# Patient Record
Sex: Female | Born: 1950 | Race: White | Hispanic: No | Smoking: Current every day smoker
Health system: Southern US, Community
[De-identification: ages and names within clinical notes are randomized; demographics above are authoritative.]

## PROBLEM LIST (undated history)

## (undated) DIAGNOSIS — M549 Dorsalgia, unspecified: Secondary | ICD-10-CM

## (undated) DIAGNOSIS — R42 Dizziness and giddiness: Secondary | ICD-10-CM

## (undated) DIAGNOSIS — E7800 Pure hypercholesterolemia, unspecified: Secondary | ICD-10-CM

## (undated) DIAGNOSIS — IMO0002 Reserved for concepts with insufficient information to code with codable children: Secondary | ICD-10-CM

## (undated) HISTORY — PX: ABDOMINAL HYSTERECTOMY: SHX81

## (undated) HISTORY — PX: BACK SURGERY: SHX140

## (undated) HISTORY — PX: APPENDECTOMY: SHX54

---

## 2010-02-01 ENCOUNTER — Inpatient Hospital Stay (HOSPITAL_COMMUNITY): Admission: RE | Admit: 2010-02-01 | Discharge: 2010-02-02 | Payer: Self-pay | Admitting: Neurosurgery

## 2010-03-22 ENCOUNTER — Encounter: Admission: RE | Admit: 2010-03-22 | Discharge: 2010-03-22 | Payer: Self-pay | Admitting: Neurosurgery

## 2010-05-16 ENCOUNTER — Encounter: Admission: RE | Admit: 2010-05-16 | Discharge: 2010-05-16 | Payer: Self-pay | Admitting: Neurosurgery

## 2010-08-26 ENCOUNTER — Encounter: Admission: RE | Admit: 2010-08-26 | Discharge: 2010-08-26 | Payer: Self-pay | Admitting: Neurosurgery

## 2010-10-29 ENCOUNTER — Encounter: Payer: Self-pay | Admitting: Neurosurgery

## 2010-11-23 ENCOUNTER — Other Ambulatory Visit: Payer: Self-pay | Admitting: Neurosurgery

## 2010-11-23 DIAGNOSIS — M545 Low back pain: Secondary | ICD-10-CM

## 2010-11-23 DIAGNOSIS — R29898 Other symptoms and signs involving the musculoskeletal system: Secondary | ICD-10-CM

## 2010-11-24 ENCOUNTER — Ambulatory Visit
Admission: RE | Admit: 2010-11-24 | Discharge: 2010-11-24 | Disposition: A | Discharge status: Home / Self Care | Payer: Self-pay | Source: Ambulatory Visit | Attending: Neurosurgery | Admitting: Neurosurgery

## 2010-11-24 DIAGNOSIS — M545 Low back pain: Secondary | ICD-10-CM

## 2010-11-24 DIAGNOSIS — R29898 Other symptoms and signs involving the musculoskeletal system: Secondary | ICD-10-CM

## 2010-12-27 LAB — SURGICAL PCR SCREEN
MRSA, PCR: NEGATIVE
Staphylococcus aureus: POSITIVE — AB

## 2010-12-27 LAB — BASIC METABOLIC PANEL
BUN: 13 mg/dL (ref 6–23)
CO2: 25 mEq/L (ref 19–32)
Calcium: 9.7 mg/dL (ref 8.4–10.5)
Chloride: 109 mEq/L (ref 96–112)
Creatinine, Ser: 0.64 mg/dL (ref 0.4–1.2)
GFR calc Af Amer: >60 mL/min (ref >60)

## 2010-12-27 LAB — CBC
MCHC: 35.4 g/dL (ref 30.0–36.0)
MCV: 94.5 fL (ref 78.0–100.0)
Platelets: 176 10*3/uL (ref 150–400)
RBC: 3.98 MIL/uL (ref 3.87–5.11)
RDW: 14.1 % (ref 11.5–15.5)

## 2011-03-06 ENCOUNTER — Emergency Department (HOSPITAL_BASED_OUTPATIENT_CLINIC_OR_DEPARTMENT_OTHER)
Admission: EM | Admit: 2011-03-06 | Discharge: 2011-03-06 | Disposition: A | Discharge status: Home / Self Care | Payer: Medicaid Other | Attending: Emergency Medicine | Admitting: Emergency Medicine

## 2011-03-06 ENCOUNTER — Emergency Department (INDEPENDENT_AMBULATORY_CARE_PROVIDER_SITE_OTHER): Payer: Medicaid Other

## 2011-03-06 DIAGNOSIS — N200 Calculus of kidney: Secondary | ICD-10-CM | POA: Insufficient documentation

## 2011-03-06 DIAGNOSIS — Z79899 Other long term (current) drug therapy: Secondary | ICD-10-CM | POA: Insufficient documentation

## 2011-03-06 DIAGNOSIS — E785 Hyperlipidemia, unspecified: Secondary | ICD-10-CM | POA: Insufficient documentation

## 2011-03-06 DIAGNOSIS — M549 Dorsalgia, unspecified: Secondary | ICD-10-CM | POA: Insufficient documentation

## 2011-03-06 DIAGNOSIS — R109 Unspecified abdominal pain: Secondary | ICD-10-CM

## 2011-03-06 LAB — CBC
MCHC: 34.5 g/dL (ref 30.0–36.0)
MCV: 93 fL (ref 78.0–100.0)
Platelets: 139 10*3/uL — ABNORMAL LOW (ref 150–400)
RDW: 13.7 % (ref 11.5–15.5)
WBC: 4.9 10*3/uL (ref 4.0–10.5)

## 2011-03-06 LAB — DIFFERENTIAL
Basophils Absolute: 0 10*3/uL (ref 0.0–0.1)
Eosinophils Absolute: 0 10*3/uL (ref 0.0–0.7)
Eosinophils Relative: 1 % (ref 0–5)

## 2011-03-06 LAB — URINALYSIS, ROUTINE W REFLEX MICROSCOPIC
Ketones, ur: NEGATIVE mg/dL
Nitrite: NEGATIVE
Protein, ur: NEGATIVE mg/dL

## 2011-04-07 ENCOUNTER — Other Ambulatory Visit: Payer: Self-pay | Admitting: Family Medicine

## 2011-04-07 ENCOUNTER — Ambulatory Visit
Admission: RE | Admit: 2011-04-07 | Discharge: 2011-04-07 | Disposition: A | Discharge status: Home / Self Care | Payer: Medicaid Other | Source: Ambulatory Visit | Attending: Family Medicine | Admitting: Family Medicine

## 2011-04-07 DIAGNOSIS — R05 Cough: Secondary | ICD-10-CM

## 2011-04-07 DIAGNOSIS — F17200 Nicotine dependence, unspecified, uncomplicated: Secondary | ICD-10-CM

## 2011-06-01 ENCOUNTER — Other Ambulatory Visit: Payer: Self-pay | Admitting: Family Medicine

## 2011-06-01 DIAGNOSIS — Z1231 Encounter for screening mammogram for malignant neoplasm of breast: Secondary | ICD-10-CM

## 2011-06-01 DIAGNOSIS — Z9889 Other specified postprocedural states: Secondary | ICD-10-CM

## 2011-06-08 ENCOUNTER — Ambulatory Visit: Payer: Medicaid Other

## 2011-06-20 ENCOUNTER — Ambulatory Visit: Payer: Medicaid Other

## 2011-08-09 IMAGING — CR DG CHEST 2V
2 series · 2 of 2 positions shown · non-contrast
Comparison: None

CLINICAL DATA: Cough, smoker

CHEST - 2 VIEW

[w chest pa]
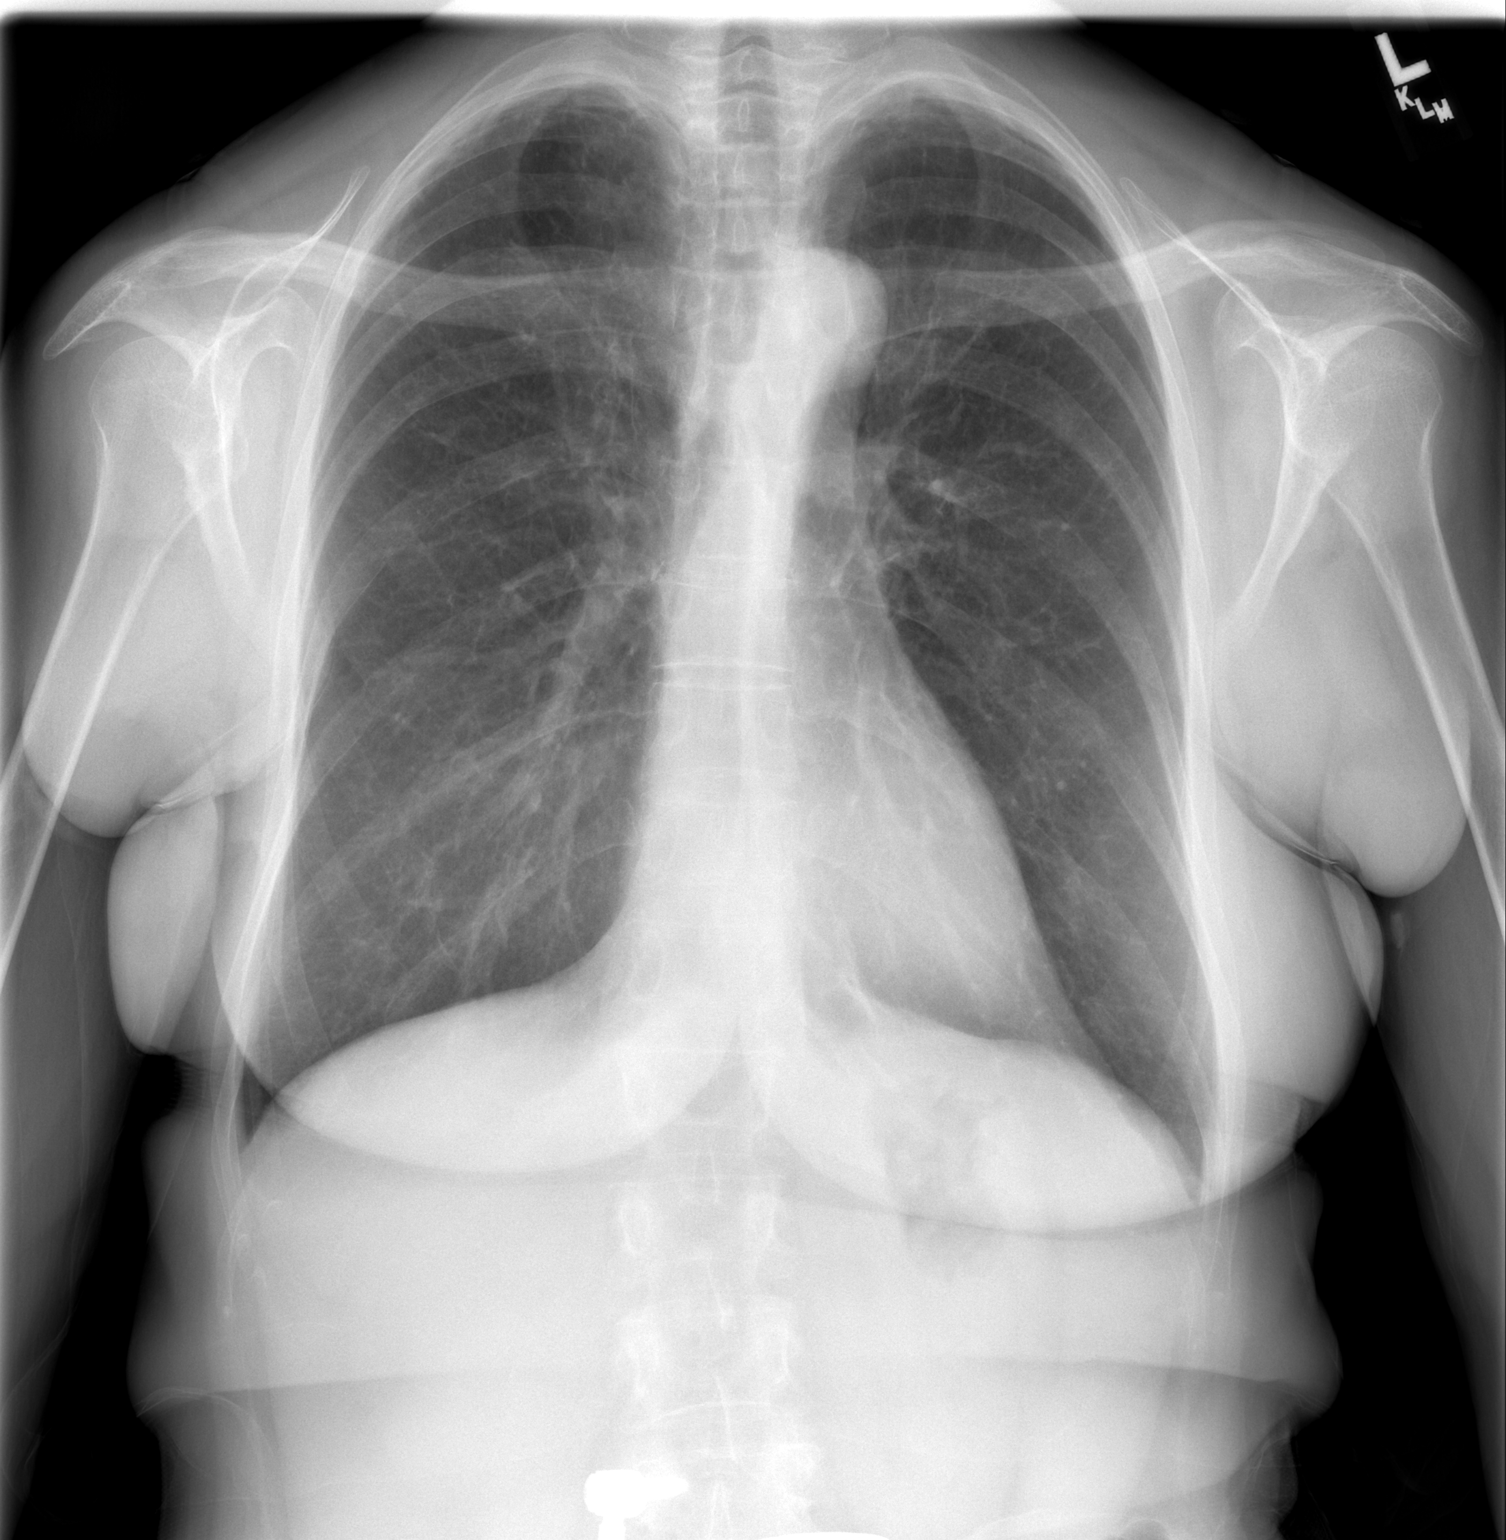

[w chest lat]
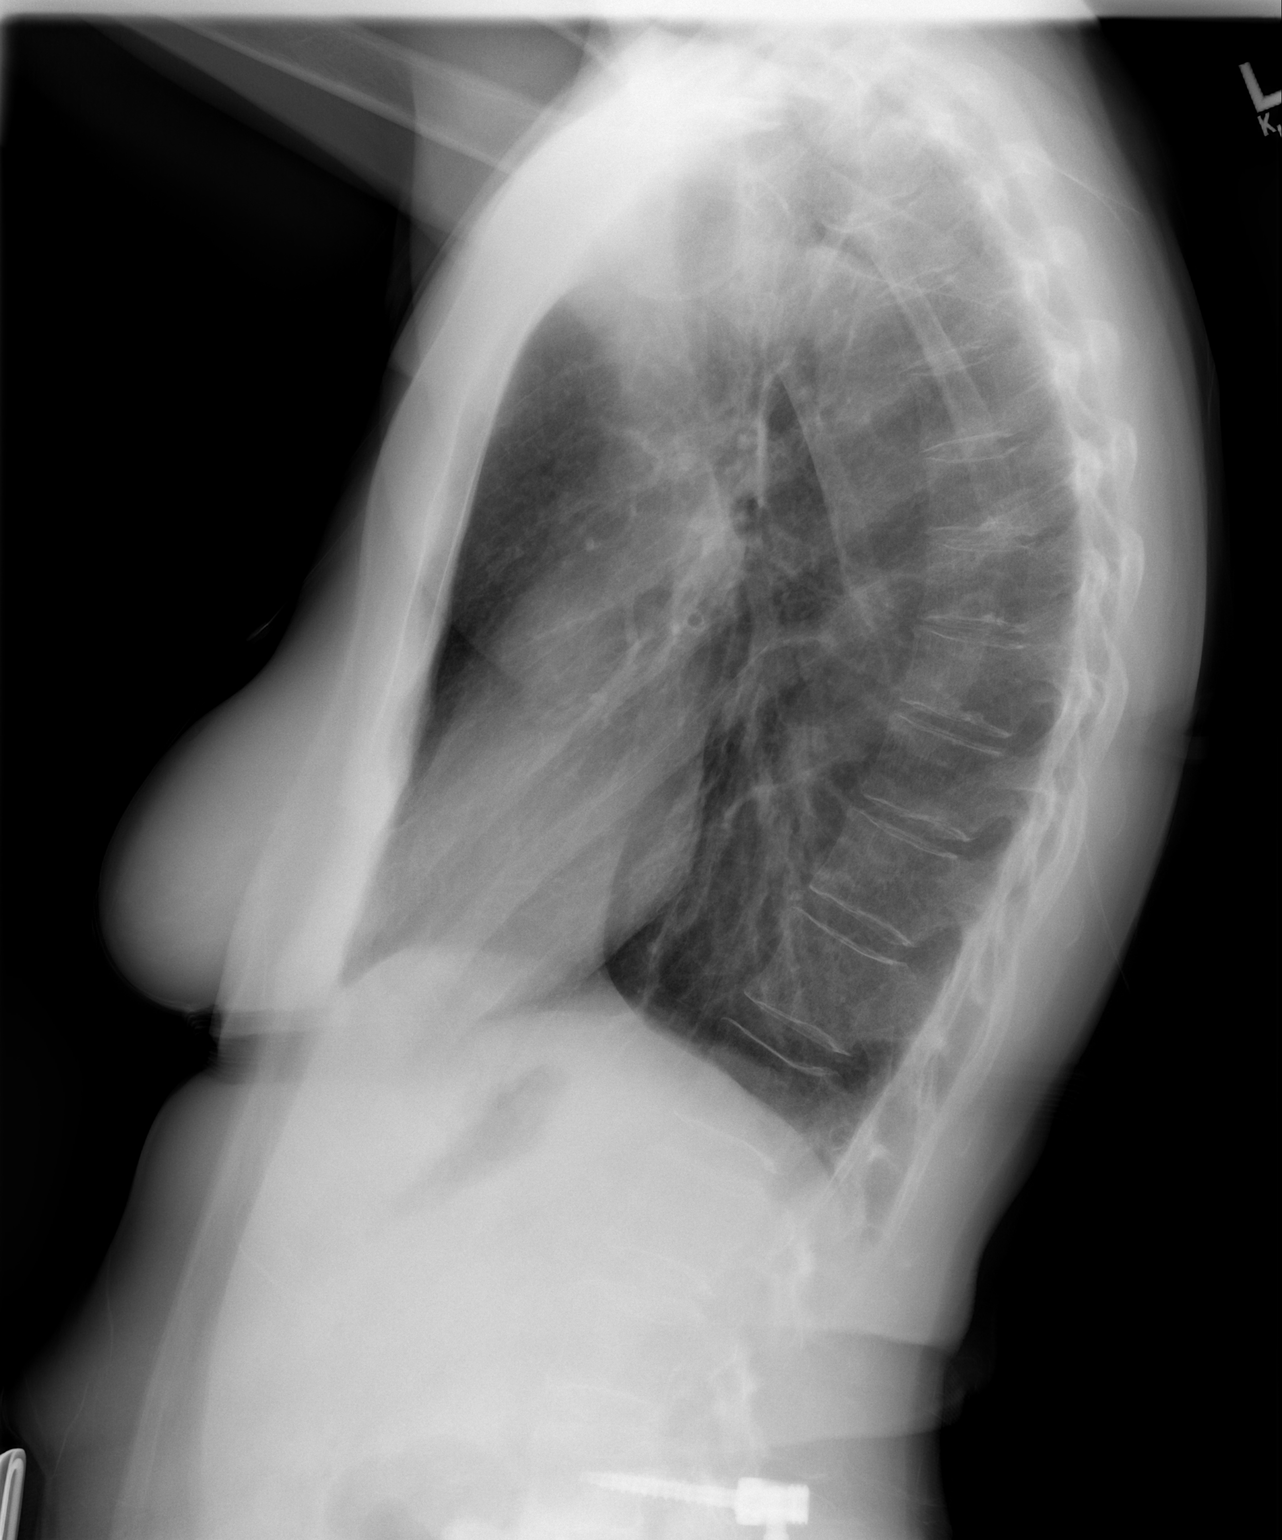

[2 of 2 positions shown; findings below may reference images not displayed]

FINDINGS: Heart size is normal.  Negative for infiltrate or
effusion.  No mass lesion is present.  Mild apical scarring
bilaterally.
IMPRESSION: No active cardiopulmonary disease.

## 2012-02-26 ENCOUNTER — Encounter (HOSPITAL_BASED_OUTPATIENT_CLINIC_OR_DEPARTMENT_OTHER): Payer: Self-pay | Admitting: Family Medicine

## 2012-02-26 ENCOUNTER — Emergency Department (HOSPITAL_BASED_OUTPATIENT_CLINIC_OR_DEPARTMENT_OTHER)
Admission: EM | Admit: 2012-02-26 | Discharge: 2012-02-26 | Disposition: A | Discharge status: Home / Self Care | Payer: Medicaid Other | Attending: Emergency Medicine | Admitting: Emergency Medicine

## 2012-02-26 DIAGNOSIS — Z79899 Other long term (current) drug therapy: Secondary | ICD-10-CM | POA: Insufficient documentation

## 2012-02-26 DIAGNOSIS — R42 Dizziness and giddiness: Secondary | ICD-10-CM | POA: Insufficient documentation

## 2012-02-26 DIAGNOSIS — IMO0002 Reserved for concepts with insufficient information to code with codable children: Secondary | ICD-10-CM | POA: Insufficient documentation

## 2012-02-26 DIAGNOSIS — M545 Low back pain, unspecified: Secondary | ICD-10-CM | POA: Insufficient documentation

## 2012-02-26 DIAGNOSIS — G8929 Other chronic pain: Secondary | ICD-10-CM | POA: Insufficient documentation

## 2012-02-26 DIAGNOSIS — F17200 Nicotine dependence, unspecified, uncomplicated: Secondary | ICD-10-CM | POA: Insufficient documentation

## 2012-02-26 DIAGNOSIS — Z7982 Long term (current) use of aspirin: Secondary | ICD-10-CM | POA: Insufficient documentation

## 2012-02-26 DIAGNOSIS — E7800 Pure hypercholesterolemia, unspecified: Secondary | ICD-10-CM | POA: Insufficient documentation

## 2012-02-26 HISTORY — DX: Reserved for concepts with insufficient information to code with codable children: IMO0002

## 2012-02-26 HISTORY — DX: Dizziness and giddiness: R42

## 2012-02-26 HISTORY — DX: Pure hypercholesterolemia, unspecified: E78.00

## 2012-02-26 LAB — URINALYSIS, ROUTINE W REFLEX MICROSCOPIC
Hgb urine dipstick: NEGATIVE
Ketones, ur: 15 mg/dL — AB
Protein, ur: NEGATIVE mg/dL
Urobilinogen, UA: 0.2 mg/dL (ref 0.0–1.0)

## 2012-02-26 LAB — URINE MICROSCOPIC-ADD ON

## 2012-02-26 MED ORDER — DIAZEPAM 5 MG PO TABS: 5.0000 mg | ORAL_TABLET | Freq: Four times a day (QID) | ORAL | Status: AC | PRN

## 2012-02-26 MED ORDER — HYDROMORPHONE HCL PF 2 MG/ML IJ SOLN
2.0000 mg | Freq: Once | INTRAMUSCULAR | Status: AC
  Administered 2012-02-26: 2 mg via INTRAMUSCULAR
  Filled 2012-02-26: qty 1

## 2012-02-26 MED ORDER — OXYCODONE-ACETAMINOPHEN 5-325 MG PO TABS: 2.0000 | ORAL_TABLET | ORAL | Status: AC | PRN

## 2012-02-26 NOTE — ED Notes (Signed)
Pt c/o right hip pain x 4 days. Pt tearful in triage.

## 2012-02-26 NOTE — ED Notes (Signed)
Patient in wheelchair; assisted to the bathroom; patient reports she can transfer with the assistance of her cane.

## 2012-02-26 NOTE — ED Notes (Signed)
Patient assisted to the bed; dressed in gown.  Patient very tearful with pain.  Vitals signs as noted.  RN to bedside.

## 2012-02-26 NOTE — Discharge Instructions (Signed)
SEEK IMMEDIATE MEDICAL ATTENTION IF: °New numbness, tingling, weakness, or problem with the use of your arms or legs.  °Severe back pain not relieved with medications.  °Change in bowel or bladder control.  °Increasing pain in any areas of the body (such as chest or abdominal pain).  °Shortness of breath, dizziness or fainting.  °Nausea (feeling sick to your stomach), vomiting, fever, or sweats. ° ° °Chronic Pain Discharge Instructions  °Emergency care providers appreciate that many patients coming to us are in severe pain and we wish to address their pain in the safest, most responsible manner.  It is important to recognize however, that the proper treatment of chronic pain differs from that of the pain of injuries and acute illnesses.  Our goal is to provide quality, safe, personalized care and we thank you for giving us the opportunity to serve you. °The use of narcotics and related agents for chronic pain syndromes may lead to additional physical and psychological problems.  Nearly as many people die from prescription narcotics each year as die from car crashes.  Additionally, this risk is increased if such prescriptions are obtained from a variety of sources.  Therefore, only your primary care physician or a pain management specialist is able to safely treat such syndromes with narcotic medications long-term.   ° °Documentation revealing such prescriptions have been sought from multiple sources may prohibit us from providing a refill or different narcotic medication.  Your name may be checked first through the [place] Controlled Substances Reporting System.  This database is a record of controlled substance medication prescriptions that the patient has received.  This has been established by [facility] in an effort to eliminate the dangerous, and often life threatening, practice of obtaining multiple prescriptions from different medical providers.  ° °If you have a chronic pain syndrome (i.e. chronic  headaches, recurrent back or neck pain, dental pain, abdominal or pelvis pain without a specific diagnosis, or neuropathic pain such as fibromyalgia) or recurrent visits for the same condition without an acute diagnosis, you may be treated with non-narcotics and other non-addictive medicines.  Allergic reactions or negative side effects that may be reported by a patient to such medications will not typically lead to the use of a narcotic analgesic or other controlled substance as an alternative. °  °Patients managing chronic pain with a personal physician should have provisions in place for breakthrough pain.  If you are in crisis, you should call your physician.  If your physician directs you to the emergency department, please have the doctor call and speak to our attending physician concerning your care. °  °When patients come to the Emergency Department (ED) with acute medical conditions in which the Emergency Department physician feels appropriate to prescribe narcotic or sedating pain medication, the physician will prescribe these in very limited quantities.  The amount of these medications will last only until you can see your primary care physician in his/her office.  Any patient who returns to the ED seeking refills should expect only non-narcotic pain medications.  ° °In the event of an acute medical condition exists and the emergency physician feels it is necessary that the patient be given a narcotic or sedating medication -  a responsible adult driver should be present in the room prior to the medication being given by the nurse. °  °Prescriptions for narcotic or sedating medications that have been lost, stolen or expired will not be refilled in the Emergency Department.   ° °Patients who   have chronic pain may receive non-narcotic prescriptions until seen by their primary care physician.  It is every patient’s personal responsibility to maintain active prescriptions with his or her primary care  physician or specialist. ° °

## 2012-02-26 NOTE — ED Provider Notes (Signed)
History     CSN: [id number]  Arrival date & time 02/26/12  1035   First MD Initiated Contact with Patient 02/26/12 1204      Chief Complaint  Patient presents with  . Back Pain    Right side    (Consider location/radiation/quality/duration/timing/severity/associated sxs/prior treatment) HPI This 61 year old female has chronic severe low back pain 24 hours a day for years, is worsening the last few weeks, she states her medications for her pain management doctor no longer helping, she states neurosurgery has released her saying she can no longer benefit from surgery, she states her family doctor is not prescribe her pain medicine any longer, she states her pain management doctor has helped her with this back pain with injections in the past, she states she has a flareup of her pain over the last 2 weeks 24 hours a day with no trauma no fever no IV drug abuse. She has no chest pain cough shortness breath or abdominal pain. She is no weakness or numbness to her legs. She is no change in bowel or bladder function. She is no recent trauma. Her pain is worse with certain position changes. It is better if she stays still. Her pain is in her low back on the right sacroiliac area. She states she is able to walk today with her cane unassisted and she uses a cane at baseline due to her severe back pain. Past Medical History  Diagnosis Date  . Degenerative disk disease   . High cholesterol   . Dizziness     Past Surgical History  Procedure Date  . Back surgery     No family history on file.  History  Substance Use Topics  . Smoking status: Current Everyday Smoker -- 1.0 packs/day  . Smokeless tobacco: Not on file  . Alcohol Use: No    OB History    Grav Para Term Preterm Abortions TAB SAB Ect Mult Living                  Review of Systems  Constitutional: Negative for fever.       10 Systems reviewed and are negative for acute change except as noted in the HPI.  HENT: Negative for  congestion.   Eyes: Negative for discharge and redness.  Respiratory: Negative for cough and shortness of breath.   Cardiovascular: Negative for chest pain.  Gastrointestinal: Negative for vomiting and abdominal pain.  Genitourinary: Negative for dysuria.  Musculoskeletal: Positive for back pain.  Skin: Negative for rash.  Neurological: Negative for syncope, weakness, numbness and headaches.  Psychiatric/Behavioral:       No behavior change.    Allergies  Shellfish-derived products  Home Medications   Current Outpatient Rx  Name Route Sig Dispense Refill  . ASPIRIN 81 MG PO TABS Oral Take 81 mg by mouth daily.    . CYCLOBENZAPRINE HCL 10 MG PO TABS Oral Take 10 mg by mouth 3 (three) times daily.    Marland Kitchen DEXILANT PO Oral Take by mouth.    Marland Kitchen LOVASTATIN 40 MG PO TABS Oral Take 40 mg by mouth daily.    Marland Kitchen MECLIZINE HCL 25 MG PO TABS Oral Take 25 mg by mouth 2 (two) times daily as needed.    . MELOXICAM 15 MG PO TABS Oral Take 15 mg by mouth daily.    Marland Kitchen MIRTAZAPINE 15 MG PO TABS Oral Take 15 mg by mouth at bedtime.    . SERTRALINE HCL 100 MG PO TABS Oral  Take 100 mg by mouth daily.    Marland Kitchen TIZANIDINE HCL 4 MG PO CAPS Oral Take 4 mg by mouth 3 (three) times daily.    . TRAZODONE HCL 100 MG PO TABS Oral Take 100 mg by mouth at bedtime.    Marland Kitchen DIAZEPAM 5 MG PO TABS Oral Take 1 tablet (5 mg total) by mouth every 6 (six) hours as needed for anxiety (spasms). 6 tablet 0  . OXYCODONE-ACETAMINOPHEN 5-325 MG PO TABS Oral Take 2 tablets by mouth every 4 (four) hours as needed for pain. 10 tablet 0    BP 125/109  Pulse 133  Temp(Src) 98.1 F (36.7 C) (Oral)  Resp 22  Ht 5\' 6"  (1.676 m)  Wt 130 lb (58.968 kg)  BMI 20.98 kg/m2  SpO2 100%  Physical Exam  Nursing note and vitals reviewed. Constitutional:       Awake, alert, nontoxic appearance with baseline speech.  HENT:  Head: Atraumatic.  Eyes: Pupils are equal, round, and reactive to light. Right eye exhibits no discharge. Left eye exhibits  no discharge.  Neck: Neck supple.  Cardiovascular: Normal rate and regular rhythm.   No murmur heard. Pulmonary/Chest: Effort normal and breath sounds normal. No respiratory distress. She has no wheezes. She has no rales. She exhibits no tenderness.  Abdominal: Soft. Bowel sounds are normal. She exhibits no mass. There is no tenderness. There is no rebound.  Musculoskeletal: She exhibits tenderness.       Thoracic back: She exhibits no tenderness.       Lumbar back: She exhibits no tenderness.       Bilateral lower extremities non tender without new rashes or color change, baseline ROM with intact DP pulses, CR<2 secs all digits bilaterally, sensation baseline light touch bilaterally for pt, DTR's symmetric and intact bilaterally KJ / AJ, motor symmetric bilateral 5 / 5 hip flexion, quadriceps, hamstrings, EHL, foot dorsiflexion, foot plantarflexion; no paralumbar soft tissue tenderness no midline lumbar tenderness she does have right sided sacroiliac joint tenderness without erythema swelling or deformity noted; her right lateral hip joint is actually nontender and she does not have hip joint pain as far as I can tell at this time  Neurological:       Mental status baseline for patient.  Upper extremity motor strength and sensation intact and symmetric bilaterally.  Skin: No rash noted.  Psychiatric: She has a normal mood and affect.    ED Course  Procedures (including critical care time)  Labs Reviewed  URINALYSIS, ROUTINE W REFLEX MICROSCOPIC - Abnormal; Notable for the following:    Color, Urine AMBER (*) BIOCHEMICALS MAY BE AFFECTED BY COLOR   APPearance CLOUDY (*)    Specific Gravity, Urine 1.034 (*)    Bilirubin Urine SMALL (*)    Ketones, ur 15 (*)    Leukocytes, UA SMALL (*)    All other components within normal limits  URINE MICROSCOPIC-ADD ON - Abnormal; Notable for the following:    Squamous Epithelial / LPF FEW (*)    All other components within normal limits   No results  found.   1. Chronic low back pain       MDM   Pt stable in ED with no significant deterioration in condition.Patient / Family / Caregiver informed of clinical course, understand medical decision-making process, and agree with plan.I doubt any other EMC precluding discharge at this time including, but not necessarily limited to the following:SBI, cauda equina.       Hurman Horn,  MD 03/04/12 1806

## 2012-04-19 ENCOUNTER — Emergency Department (HOSPITAL_BASED_OUTPATIENT_CLINIC_OR_DEPARTMENT_OTHER)
Admission: EM | Admit: 2012-04-19 | Discharge: 2012-04-19 | Disposition: A | Discharge status: Home / Self Care | Payer: Medicaid Other | Attending: Emergency Medicine | Admitting: Emergency Medicine

## 2012-04-19 ENCOUNTER — Encounter (HOSPITAL_BASED_OUTPATIENT_CLINIC_OR_DEPARTMENT_OTHER): Payer: Self-pay | Admitting: Emergency Medicine

## 2012-04-19 DIAGNOSIS — F17200 Nicotine dependence, unspecified, uncomplicated: Secondary | ICD-10-CM | POA: Insufficient documentation

## 2012-04-19 DIAGNOSIS — IMO0002 Reserved for concepts with insufficient information to code with codable children: Secondary | ICD-10-CM | POA: Insufficient documentation

## 2012-04-19 DIAGNOSIS — G8929 Other chronic pain: Secondary | ICD-10-CM

## 2012-04-19 DIAGNOSIS — E7800 Pure hypercholesterolemia, unspecified: Secondary | ICD-10-CM | POA: Insufficient documentation

## 2012-04-19 MED ORDER — HYDROMORPHONE HCL PF 2 MG/ML IJ SOLN
2.0000 mg | Freq: Once | INTRAMUSCULAR | Status: AC
  Administered 2012-04-19: 2 mg via INTRAMUSCULAR
  Filled 2012-04-19: qty 1

## 2012-04-19 MED ORDER — DIAZEPAM 5 MG PO TABS
10.0000 mg | ORAL_TABLET | Freq: Once | ORAL | Status: AC
  Administered 2012-04-19: 10 mg via ORAL
  Filled 2012-04-19: qty 2

## 2012-04-19 MED ORDER — OXYCODONE-ACETAMINOPHEN 5-325 MG PO TABS: ORAL_TABLET | ORAL | Status: AC

## 2012-04-19 MED ORDER — DIAZEPAM 5 MG PO TABS: 5.0000 mg | ORAL_TABLET | Freq: Three times a day (TID) | ORAL | Status: AC | PRN

## 2012-04-19 NOTE — ED Notes (Signed)
Pt c/o chronic low back pain and sts she is out of pain medication. Denies injury.

## 2012-04-19 NOTE — ED Provider Notes (Signed)
History     CSN: [id number]  Arrival date & time 04/19/12  1025   First MD Initiated Contact with Patient 04/19/12 1035      Chief Complaint  Patient presents with  . Back Pain    (Consider location/radiation/quality/duration/timing/severity/associated sxs/prior treatment) HPI Comments: Pt with chronic back pain, mostly in right hip area, uses a cane to help her walk, doesn't have pain daily per friend, but when I ask pt, she takes medications every day.  No new injury, no difficulty urinating.  No fevers, chills.  No new numbness or weakness.  Movement o fright hip and leg causes back pain to worsen.  Radiates down posterior buttock and into hip.  No rash.  No abd pain, N/V/D.  She ran out of valium 2 days ago.  Has not had usual oxycodone for a few months.  Old records shows she was in the ED on 5/20, same complaint, received Rx for 10 oxycodone and 6 valium at that time.  She has an appt with a new provider on Tuesday.    Patient is a 61 y.o. female presenting with back pain. The history is provided by the patient and a friend.  Back Pain  Pertinent negatives include no fever, no numbness, no abdominal pain, no dysuria and no weakness.    Past Medical History  Diagnosis Date  . Degenerative disk disease   . High cholesterol   . Dizziness     Past Surgical History  Procedure Date  . Back surgery     History reviewed. No pertinent family history.  History  Substance Use Topics  . Smoking status: Current Everyday Smoker -- 1.0 packs/day  . Smokeless tobacco: Not on file  . Alcohol Use: No    OB History    Grav Para Term Preterm Abortions TAB SAB Ect Mult Living                  Review of Systems  Constitutional: Negative for fever and chills.  Gastrointestinal: Negative for nausea, vomiting and abdominal pain.  Genitourinary: Negative for dysuria, urgency and flank pain.  Musculoskeletal: Positive for back pain.  Skin: Negative for rash and wound.  Neurological:  Negative for weakness and numbness.  All other systems reviewed and are negative.    Allergies  Benadryl; Other; Shellfish-derived products; and Toradol  Home Medications   Current Outpatient Rx  Name Route Sig Dispense Refill  . ASPIRIN 81 MG PO TABS Oral Take 81 mg by mouth daily.    . CYCLOBENZAPRINE HCL 10 MG PO TABS Oral Take 10 mg by mouth 3 (three) times daily.    Marland Kitchen DEXILANT PO Oral Take by mouth.    Marland Kitchen DIAZEPAM 5 MG PO TABS Oral Take 1 tablet (5 mg total) by mouth every 8 (eight) hours as needed for anxiety. 6 tablet 0  . LOVASTATIN 40 MG PO TABS Oral Take 40 mg by mouth daily.    Marland Kitchen MECLIZINE HCL 25 MG PO TABS Oral Take 25 mg by mouth 2 (two) times daily as needed.    . MELOXICAM 15 MG PO TABS Oral Take 15 mg by mouth daily.    Marland Kitchen MIRTAZAPINE 15 MG PO TABS Oral Take 15 mg by mouth at bedtime.    . OXYCODONE-ACETAMINOPHEN 5-325 MG PO TABS  1-2 tablets po q 6 hours prn moderate to severe pain 15 tablet 0  . SERTRALINE HCL 100 MG PO TABS Oral Take 100 mg by mouth daily.    Marland Kitchen  TIZANIDINE HCL 4 MG PO CAPS Oral Take 4 mg by mouth 3 (three) times daily.    . TRAZODONE HCL 100 MG PO TABS Oral Take 100 mg by mouth at bedtime.      BP 120/83  Pulse 95  Temp 99 F (37.2 C) (Oral)  Resp 16  SpO2 99%  Physical Exam  Nursing note and vitals reviewed. Constitutional: She is oriented to person, place, and time. She appears well-developed and well-nourished. She appears distressed.  HENT:  Head: Normocephalic and atraumatic.  Eyes: Pupils are equal, round, and reactive to light.  Neck: Neck supple.  Pulmonary/Chest: Effort normal.  Abdominal: Soft. She exhibits no distension. There is no tenderness.  Musculoskeletal:       Lumbar back: She exhibits decreased range of motion, tenderness, pain and spasm. She exhibits no bony tenderness, no swelling, no deformity, no laceration and normal pulse.       + straight leg test on right side  Neurological: She is alert and oriented to person,  place, and time. She has normal strength. No sensory deficit.  Reflex Scores:      Patellar reflexes are 2+ on the right side and 2+ on the left side. Skin: Skin is warm.  Psychiatric:       tearful    ED Course  Procedures (including critical care time)  Labs Reviewed - No data to display No results found.   1. Chronic back pain       MDM  Pt with acute exacerbation of low back pain.  Also ran out of meds.  Has appt on Tuesday, will treat with analgesics here, give short amount of Rx to last till then.         Gavin Pound. Raeghan Demeter, MD 04/19/12 1048

## 2012-04-19 NOTE — Discharge Instructions (Signed)
 Chronic Pain Discharge Instructions  Emergency care providers appreciate that many patients coming to us  are in severe pain and we wish to address their pain in the safest, most responsible manner.  It is important to recognize however, that the proper treatment of chronic pain differs from that of the pain of injuries and acute illnesses.  Our goal is to provide quality, safe, personalized care and we thank you for giving us  the opportunity to serve you. The use of narcotics and related agents for chronic pain syndromes may lead to additional physical and psychological problems.  Nearly as many people die from prescription narcotics each year as die from car crashes.  Additionally, this risk is increased if such prescriptions are obtained from a variety of sources.  Therefore, only your primary care physician or a pain management specialist is able to safely treat such syndromes with narcotic medications long-term.    Documentation revealing such prescriptions have been sought from multiple sources may prohibit us  from providing a refill or different narcotic medication.  Your name may be checked first through the [facility]  Controlled Substances Reporting System.  This database is a record of controlled substance medication prescriptions that the patient has received.  This has been established by [facility]  in an effort to eliminate the dangerous, and often life threatening, practice of obtaining multiple prescriptions from different medical providers.   If you have a chronic pain syndrome (i.e. chronic headaches, recurrent back or neck pain, dental pain, abdominal or pelvis pain without a specific diagnosis, or neuropathic pain such as fibromyalgia) or recurrent visits for the same condition without an acute diagnosis, you may be treated with non-narcotics and other non-addictive medicines.  Allergic reactions or negative side effects that may be reported by a patient to such medications will not  typically lead to the use of a narcotic analgesic or other controlled substance as an alternative.   Patients managing chronic pain with a personal physician should have provisions in place for breakthrough pain.  If you are in crisis, you should call your physician.  If your physician directs you to the emergency department, please have the doctor call and speak to our attending physician concerning your care.   When patients come to the Emergency Department (ED) with acute medical conditions in which the Emergency Department physician feels appropriate to prescribe narcotic or sedating pain medication, the physician will prescribe these in very limited quantities.  The amount of these medications will last only until you can see your primary care physician in his/her office.  Any patient who returns to the ED seeking refills should expect only non-narcotic pain medications.   In the event of an acute medical condition exists and the emergency physician feels it is necessary that the patient be given a narcotic or sedating medication -  a responsible adult driver should be present in the room prior to the medication being given by the nurse.   Prescriptions for narcotic or sedating medications that have been lost, stolen or expired will not be refilled in the Emergency Department.    Patients who have chronic pain may receive non-narcotic prescriptions until seen by their primary care physician.  It is every patient's personal responsibility to maintain active prescriptions with his or her primary care physician or specialist.      Narcotic and benzodiazepine use may cause drowsiness, slowed breathing or dependence.  Please use with caution and do not drive, operate machinery or watch young children alone while taking them.  Taking combinations of these medications or drinking alcohol will potentiate these effects.

## 2012-04-23 NOTE — ED Notes (Signed)
Pt presented to dept. Today with same caregiver/friend that was present with pt on previous visit. Pt brought paperwork from pain management clinic and requesting that current MD provide proper documentation due to pt receiving narcotic prescriptions on last visit. Dr. Oletta Lamas not in dept today and will return per schedule to this dept on Thursday. Pt informed that only Dr. Oletta Lamas can fill out form. This nurse could not find documentation that pt disclosed that she was starting in pain management program. Caregiver/friend stated "maybe she didn't say she was going to pain management, she just said she is seeing a new Dr. On Tuesday". Paperwork requires documentation whether pt disclosed this info or not. Thayer Jew., RN spoke with pt and informed pt that she would email Dr. Oletta Lamas to make him aware of situation. Pt sts she will return Thursday with paperwork and to get it signed at that time.

## 2012-05-29 ENCOUNTER — Emergency Department (HOSPITAL_BASED_OUTPATIENT_CLINIC_OR_DEPARTMENT_OTHER)
Admission: EM | Admit: 2012-05-29 | Discharge: 2012-05-29 | Disposition: A | Discharge status: Home / Self Care | Payer: Medicaid Other | Attending: Emergency Medicine | Admitting: Emergency Medicine

## 2012-05-29 ENCOUNTER — Encounter (HOSPITAL_BASED_OUTPATIENT_CLINIC_OR_DEPARTMENT_OTHER): Payer: Self-pay | Admitting: *Deleted

## 2012-05-29 DIAGNOSIS — G8929 Other chronic pain: Secondary | ICD-10-CM | POA: Insufficient documentation

## 2012-05-29 DIAGNOSIS — E7800 Pure hypercholesterolemia, unspecified: Secondary | ICD-10-CM | POA: Insufficient documentation

## 2012-05-29 DIAGNOSIS — F17200 Nicotine dependence, unspecified, uncomplicated: Secondary | ICD-10-CM | POA: Insufficient documentation

## 2012-05-29 DIAGNOSIS — IMO0002 Reserved for concepts with insufficient information to code with codable children: Secondary | ICD-10-CM | POA: Insufficient documentation

## 2012-05-29 LAB — CBC WITH DIFFERENTIAL/PLATELET
Basophils Relative: 0 % (ref 0–1)
Eosinophils Absolute: 0 10*3/uL (ref 0.0–0.7)
Eosinophils Relative: 0 % (ref 0–5)
Hemoglobin: 14.7 g/dL (ref 12.0–15.0)
Lymphs Abs: 1.9 10*3/uL (ref 0.7–4.0)
MCH: 31.3 pg (ref 26.0–34.0)
MCHC: 35.5 g/dL (ref 30.0–36.0)
MCV: 88.3 fL (ref 78.0–100.0)
Monocytes Absolute: 0.4 10*3/uL (ref 0.1–1.0)
Monocytes Relative: 7 % (ref 3–12)
Neutrophils Relative %: 62 % (ref 43–77)

## 2012-05-29 LAB — COMPREHENSIVE METABOLIC PANEL
Albumin: 4.2 g/dL (ref 3.5–5.2)
BUN: 10 mg/dL (ref 6–23)
Calcium: 9.6 mg/dL (ref 8.4–10.5)
Creatinine, Ser: 0.8 mg/dL (ref 0.50–1.10)
GFR calc Af Amer: >90 mL/min (ref >90)
Glucose, Bld: 106 mg/dL — ABNORMAL HIGH (ref 70–99)
Potassium: 3.4 mEq/L — ABNORMAL LOW (ref 3.5–5.1)
Total Protein: 7 g/dL (ref 6.0–8.3)

## 2012-05-29 MED ORDER — HYDROMORPHONE HCL PF 1 MG/ML IJ SOLN
1.0000 mg | Freq: Once | INTRAMUSCULAR | Status: AC
  Administered 2012-05-29: 1 mg via INTRAVENOUS
  Filled 2012-05-29: qty 1

## 2012-05-29 MED ORDER — HYDROCODONE-ACETAMINOPHEN 5-325 MG PO TABS: 2.0000 | ORAL_TABLET | ORAL | Status: AC | PRN

## 2012-05-29 MED ORDER — SODIUM CHLORIDE 0.9 % IV SOLN
Freq: Once | INTRAVENOUS | Status: AC
  Administered 2012-05-29: 20:00:00 via INTRAVENOUS

## 2012-05-29 MED ORDER — ONDANSETRON HCL 4 MG/2ML IJ SOLN
4.0000 mg | Freq: Once | INTRAMUSCULAR | Status: AC
  Administered 2012-05-29: 4 mg via INTRAVENOUS
  Filled 2012-05-29: qty 2

## 2012-05-29 MED ORDER — LORAZEPAM 2 MG/ML IJ SOLN
1.0000 mg | Freq: Once | INTRAMUSCULAR | Status: AC
  Administered 2012-05-29: 1 mg via INTRAVENOUS
  Filled 2012-05-29: qty 1
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
## 2012-05-29 NOTE — ED Notes (Signed)
Discussed with pt and ex-husband at great length about chronic narcotic use. Family wants pt to go to detox for narcotics, however pt does not want to do this. Pt states she needs the narcotics for chronic pain and denies abuse of narcotics. Explained to family detox is not the appropriate treatment if pt plans on using narcotics in the future for chronic pain.  Advised family to control bulk of pt's narcotics and only dispense medications at prescribed times. Family agreed this seemed like an appropriate solution until follow up with pain management and PMD can be done.
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
## 2012-05-29 NOTE — ED Notes (Signed)
Pts sister called and requested to speak with a  Nurse.  Sister sts she is in the process of getting POA for pt but it is not through yet.  Sts she wants pt transferred to a detox facility.  Pt initially agreed with this plan.  PA came to room and pt denied wanting detox.  Sts she cannot detox from her narcotics because her back pain is too bad.  Pts sister was insistent that pt be admitted.  PA and myself confirmed with pt that she is not suicidal and she is A&O. Pt declines to be admitted for detox

## 2012-05-31 NOTE — ED Provider Notes (Signed)
Medical screening examination/treatment/procedure(s) were performed by non-physician practitioner and as supervising physician I was immediately available for consultation/collaboration.  Geoffery Lyons, MD 05/31/12 [phone]

## 2012-09-06 ENCOUNTER — Other Ambulatory Visit: Payer: Self-pay | Admitting: Neurosurgery

## 2012-09-06 DIAGNOSIS — M545 Low back pain: Secondary | ICD-10-CM

## 2012-09-12 ENCOUNTER — Ambulatory Visit
Admission: RE | Admit: 2012-09-12 | Discharge: 2012-09-12 | Disposition: A | Discharge status: Home / Self Care | Payer: Medicaid Other | Source: Ambulatory Visit | Attending: Neurosurgery | Admitting: Neurosurgery

## 2012-09-12 DIAGNOSIS — M545 Low back pain: Secondary | ICD-10-CM

## 2012-09-12 MED ORDER — GADOBENATE DIMEGLUMINE 529 MG/ML IV SOLN
10.0000 mL | Freq: Once | INTRAVENOUS | Status: AC | PRN
  Administered 2012-09-12: 10 mL via INTRAVENOUS

## 2021-09-27 ENCOUNTER — Emergency Department (HOSPITAL_COMMUNITY): Payer: Medicare HMO

## 2021-09-27 ENCOUNTER — Emergency Department (HOSPITAL_COMMUNITY)
Admission: EM | Admit: 2021-09-27 | Discharge: 2021-09-27 | Disposition: A | Discharge status: Home / Self Care | Payer: Medicare HMO | Attending: Student | Admitting: Student

## 2021-09-27 DIAGNOSIS — F17210 Nicotine dependence, cigarettes, uncomplicated: Secondary | ICD-10-CM | POA: Diagnosis not present

## 2021-09-27 DIAGNOSIS — M542 Cervicalgia: Secondary | ICD-10-CM | POA: Diagnosis not present

## 2021-09-27 DIAGNOSIS — Y92410 Unspecified street and highway as the place of occurrence of the external cause: Secondary | ICD-10-CM | POA: Insufficient documentation

## 2021-09-27 DIAGNOSIS — Z7982 Long term (current) use of aspirin: Secondary | ICD-10-CM | POA: Diagnosis not present

## 2021-09-27 DIAGNOSIS — S199XXA Unspecified injury of neck, initial encounter: Secondary | ICD-10-CM | POA: Insufficient documentation

## 2021-09-27 MED ORDER — LIDOCAINE 5 % EX PTCH: 1.0000 | MEDICATED_PATCH | CUTANEOUS | 0 refills | Status: AC

## 2021-09-27 NOTE — Discharge Instructions (Addendum)
Tylenol as needed.  Lidocaine patches
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
## 2021-09-27 NOTE — ED Notes (Signed)
E-signature pad unavailable at time of pt discharge. This RN discussed discharge materials with pt and answered all pt questions. Pt stated understanding of discharge material. ? ?

## 2021-10-09 ENCOUNTER — Other Ambulatory Visit: Payer: Self-pay

## 2021-10-09 ENCOUNTER — Emergency Department (HOSPITAL_COMMUNITY)
Admission: EM | Admit: 2021-10-09 | Discharge: 2021-10-09 | Disposition: A | Discharge status: Home / Self Care | Payer: Medicare HMO | Attending: Emergency Medicine | Admitting: Emergency Medicine

## 2021-10-09 ENCOUNTER — Encounter (HOSPITAL_COMMUNITY): Payer: Self-pay | Admitting: Emergency Medicine

## 2021-10-09 DIAGNOSIS — Z79899 Other long term (current) drug therapy: Secondary | ICD-10-CM | POA: Diagnosis not present

## 2021-10-09 DIAGNOSIS — Z7982 Long term (current) use of aspirin: Secondary | ICD-10-CM | POA: Insufficient documentation

## 2021-10-09 DIAGNOSIS — Z5321 Procedure and treatment not carried out due to patient leaving prior to being seen by health care provider: Secondary | ICD-10-CM

## 2021-10-09 DIAGNOSIS — M5441 Lumbago with sciatica, right side: Secondary | ICD-10-CM | POA: Insufficient documentation

## 2021-10-09 DIAGNOSIS — M545 Low back pain, unspecified: Secondary | ICD-10-CM | POA: Diagnosis present

## 2021-10-09 HISTORY — DX: Dorsalgia, unspecified: M54.9

## 2021-10-09 MED ORDER — OXYCODONE-ACETAMINOPHEN 5-325 MG PO TABS
1.0000 | ORAL_TABLET | Freq: Once | ORAL | Status: AC
  Administered 2021-10-09: 1 via ORAL
  Filled 2021-10-09: qty 1

## 2021-10-09 MED ORDER — ONDANSETRON 4 MG PO TBDP
8.0000 mg | ORAL_TABLET | Freq: Once | ORAL | Status: AC
  Administered 2021-10-09: 8 mg via ORAL
  Filled 2021-10-09: qty 2

## 2021-10-09 NOTE — ED Provider Notes (Signed)
Emergency Medicine Provider Triage Evaluation Note  Dominique Walsh , a 71 y.o. female  was evaluated in triage.  Pt complains of worsening low back pain.  Patient has had ongoing issues with low back pain with remote history of surgery.  She reports her back pain has been worsening and now radiates down her right leg and she reports today her leg gave out and she almost fell, fortunately had a cane.  She reports today she has been having decreased sensation in her right leg and feels like it is going numb and she is having difficulty walking and putting weight on it because it feels weak.  No loss of bowel or bladder control.  Review of Systems  Positive: Back pain, numbness, weakness Negative: Fevers, abdominal pain, loss of bowel or bladder control  Physical Exam  BP 118/88 (BP Location: Left Arm)    Pulse (!) 103    Temp 98.3 F (36.8 C) (Oral)    Resp 16    SpO2 100%  Gen:   Awake, no distress   Resp:  Normal effort  MSK:   Pain with palpation over the midline lumbar spine in right low back musculature.   Other:  Decreased sensation throughout the right lower leg and 4+/5 strength in the right leg  Medical Decision Making  Medically screening exam initiated at 2:58 PM.  Appropriate orders placed.  [name] was informed that the remainder of the evaluation will be completed by another provider, this initial triage assessment does not replace that evaluation, and the importance of remaining in the ED until their evaluation is complete.     Dartha Lodge, PA-C 10/09/21 1508    Linwood Dibbles, MD 10/09/21 [phone]

## 2021-10-09 NOTE — ED Notes (Signed)
Transport arrived to get this patient for MRI. Pt was no where to be found. Sophie, RN called x-ray, CT, and MRI to see if they had the pt. All said no. PA is aware. Pts hat and cane are still on the bed.

## 2021-10-09 NOTE — ED Notes (Signed)
ED staff called the mobile phone number listed in pt's chart in attempt to locate pt. Phone rang until reaching pt's voicemail.

## 2021-10-09 NOTE — ED Provider Notes (Signed)
[facility] EMERGENCY DEPARTMENT Provider Note   CSN: [id number] Arrival date & time: 10/09/21  1434     History  Chief Complaint  Patient presents with   Back Pain    Dominique Walsh is a 71 y.o. female.  HPI Patient is a 71 year old female with history of back pain, DDD, previous back surgery, who presents to the emergency department due to low back pain.  Patient states she has had low back pain "all her life".  She states that this morning she was walking out of a store and her right leg suddenly "gave out" and she began experiencing radiating pain in the low back and right leg as well as a decrease in sensation.  Denies any difficulty urinating.  No bowel or bladder incontinence.  States that she has had to use a cane for the rest of the day due to her pain.  Denies any falls or trauma.  No fevers or chills.    Home Medications Prior to Admission medications   Medication Sig Start Date End Date Taking? Authorizing Provider  aspirin 81 MG tablet Take 81 mg by mouth daily.    [provider]  cyclobenzaprine (FLEXERIL) 10 MG tablet Take 10 mg by mouth 3 (three) times daily.    [provider]  dexlansoprazole (DEXILANT) 60 MG capsule Take 60 mg by mouth daily.    [provider]  diazepam (VALIUM) 10 MG tablet Take 10 mg by mouth every 6 (six) hours as needed.    [provider]  HYDROcodone-acetaminophen (NORCO) 10-325 MG per tablet Take 1 tablet by mouth every 6 (six) hours as needed.    [provider]  lidocaine (LIDODERM) 5 % Place 1 patch onto the skin daily. Remove & Discard patch within 12 hours or as directed by MD 09/27/21   Elson Areas, PA-C  lovastatin (MEVACOR) 40 MG tablet Take 40 mg by mouth daily.    [provider]  meclizine (ANTIVERT) 25 MG tablet Take 25 mg by mouth 2 (two) times daily as needed.    [provider]  meloxicam (MOBIC) 15 MG tablet Take 15 mg by mouth daily.     [provider]  mirtazapine (REMERON) 15 MG tablet Take 15 mg by mouth at bedtime.    [provider]  sertraline (ZOLOFT) 100 MG tablet Take 100 mg by mouth daily.    [provider]  tiZANidine (ZANAFLEX) 4 MG capsule Take 4 mg by mouth 3 (three) times daily.    [provider]  traZODone (DESYREL) 100 MG tablet Take 100 mg by mouth at bedtime.    [provider]      Allergies    Benadryl [diphenhydramine hcl], Other, Shellfish-derived products, and Toradol [ketorolac tromethamine]    Review of Systems   Review of Systems  All other systems reviewed and are negative. Ten systems reviewed and are negative for acute change, except as noted in the HPI.   Physical Exam Updated Vital Signs BP 108/77 (BP Location: Right Arm)    Pulse 89    Temp 98.3 F (36.8 C) (Oral)    Resp 16    SpO2 97%  Physical Exam Vitals and nursing note reviewed.  Constitutional:      General: She is not in acute distress.    Appearance: Normal appearance. She is not ill-appearing, toxic-appearing or diaphoretic.  HENT:     Head: Normocephalic and atraumatic.     Right Ear: External  ear normal.     Left Ear: External ear normal.     Nose: Nose normal.     Mouth/Throat:     Mouth: Mucous membranes are moist.     Pharynx: Oropharynx is clear. No oropharyngeal exudate or posterior oropharyngeal erythema.  Eyes:     Extraocular Movements: Extraocular movements intact.  Cardiovascular:     Rate and Rhythm: Normal rate and regular rhythm.     Pulses: Normal pulses.     Heart sounds: Normal heart sounds. No murmur heard.   No friction rub. No gallop.  Pulmonary:     Effort: Pulmonary effort is normal. No respiratory distress.     Breath sounds: Normal breath sounds. No stridor. No wheezing, rhonchi or rales.  Abdominal:     General: Abdomen is flat.     Tenderness: There is no abdominal tenderness.  Musculoskeletal:        General: Tenderness present. Normal  range of motion.     Cervical back: Normal range of motion and neck supple. No tenderness.     Comments: Moderate TTP noted overlying the midline lumbar spine around the L5-S1 region.  No step-offs, crepitus, or deformities.  Additional moderate TTP noted along the right lumbar musculature and right buttock.  Positive straight leg raise on the right.  Skin:    General: Skin is warm and dry.  Neurological:     General: No focal deficit present.     Mental Status: She is alert and oriented to person, place, and time.     Comments: A&O x3.  Speaking clearly and coherently.  Strength is 5/5 in the left leg.  Strength is 4/5 in the right leg.  Subjective decrease in sensation noted diffusely in the right leg.  2+ pedal pulses.  Psychiatric:        Mood and Affect: Mood normal.        Behavior: Behavior normal.   ED Results / Procedures / Treatments   Labs (all labs ordered are listed, but only abnormal results are displayed) Labs Reviewed - No data to display  EKG None  Radiology No results found.  Procedures Procedures   Medications Ordered in ED Medications  ondansetron (ZOFRAN-ODT) disintegrating tablet 8 mg (8 mg Oral Given 10/09/21 1713)  oxyCODONE-acetaminophen (PERCOCET/ROXICET) 5-325 MG per tablet 1 tablet (1 tablet Oral Given 10/09/21 1713)    ED Course/ Medical Decision Making/ A&P                           Medical Decision Making  Pt is a 71 y.o. female who presents to the emergency department due to low back pain as well as weakness and numbness in the right leg.  On my exam patient was having moderate TTP over the midline lumbar spine as well as moderate TTP overlying the right lumbar musculature and right buttock.  Positive straight leg raise.  Strength was 4/5 in the right leg with subjective decrease in sensation diffusely in the right leg.  Patient notes a previous surgery to the lumbar spine.  Patient denied any bowel or bladder incontinence or urinary retention.   Exam and history appeared concerning for sciatica versus cauda equina so MRI of the lumbar spine was ordered in triage.  Nursing staff notified me that patient had eloped from the emergency department.  She did not notify nursing staff prior to eloping.  Staff attempted to find patient on campus and contact the patient unsuccessfully.  Note: Portions of this report may have been transcribed using voice recognition software. Every effort was made to ensure accuracy; however, inadvertent computerized transcription errors may be present.   Final Clinical Impression(s) / ED Diagnoses Final diagnoses:  Acute midline low back pain with right-sided sciatica  Eloped from emergency department    Rx / DC Orders ED Discharge Orders     None         Placido SouJoldersma, Luis Sami, PA-C 10/09/21 2021    Wynetta FinesMessick, Peter C, MD 10/11/21 1036

## 2021-10-09 NOTE — ED Triage Notes (Signed)
C/o chronic lower back pain that has been worse x 1 month with R leg pain.  States R leg gave out.

## 2021-10-09 NOTE — ED Notes (Signed)
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 According to off-duty [employer], pt was seen willingly walking to the bathroom with a steady gait, no cane used, got dressed and walked out to the lobby. She was then seen going up to the second floor by the Employee entrance, and walking to the parking lot. [place], [place] is aware.
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
